# Patient Record
Sex: Male | Born: 1960 | Race: White | Hispanic: No | Marital: Married | Smoking: Current some day smoker
Health system: Southern US, Community
[De-identification: ages and names within clinical notes are randomized; demographics above are authoritative.]

## PROBLEM LIST (undated history)

## (undated) DIAGNOSIS — C8590 Non-Hodgkin lymphoma, unspecified, unspecified site: Secondary | ICD-10-CM

## (undated) DIAGNOSIS — Z8579 Personal history of other malignant neoplasms of lymphoid, hematopoietic and related tissues: Secondary | ICD-10-CM

## (undated) DIAGNOSIS — Z9889 Other specified postprocedural states: Secondary | ICD-10-CM

## (undated) DIAGNOSIS — E7800 Pure hypercholesterolemia, unspecified: Secondary | ICD-10-CM

## (undated) DIAGNOSIS — E119 Type 2 diabetes mellitus without complications: Secondary | ICD-10-CM

## (undated) DIAGNOSIS — Z9049 Acquired absence of other specified parts of digestive tract: Secondary | ICD-10-CM

## (undated) DIAGNOSIS — M67919 Unspecified disorder of synovium and tendon, unspecified shoulder: Secondary | ICD-10-CM

## (undated) DIAGNOSIS — I358 Other nonrheumatic aortic valve disorders: Secondary | ICD-10-CM

## (undated) DIAGNOSIS — Z8572 Personal history of non-Hodgkin lymphomas: Secondary | ICD-10-CM

## (undated) DIAGNOSIS — M109 Gout, unspecified: Secondary | ICD-10-CM

## (undated) DIAGNOSIS — I10 Essential (primary) hypertension: Secondary | ICD-10-CM

## (undated) HISTORY — DX: Pure hypercholesterolemia, unspecified: E78.00

## (undated) HISTORY — DX: Other specified postprocedural states: Z98.890

## (undated) HISTORY — DX: Non-Hodgkin lymphoma, unspecified, unspecified site: C85.90

## (undated) HISTORY — DX: Other nonrheumatic aortic valve disorders: I35.8

## (undated) HISTORY — PX: TONSILECTOMY, ADENOIDECTOMY, BILATERAL MYRINGOTOMY AND TUBES: SHX2538

## (undated) HISTORY — DX: Personal history of other malignant neoplasms of lymphoid, hematopoietic and related tissues: Z85.79

## (undated) HISTORY — DX: Unspecified disorder of synovium and tendon, unspecified shoulder: M67.919

## (undated) HISTORY — DX: Essential (primary) hypertension: I10

## (undated) HISTORY — DX: Acquired absence of other specified parts of digestive tract: Z90.49

## (undated) HISTORY — DX: Gout, unspecified: M10.9

## (undated) HISTORY — DX: Type 2 diabetes mellitus without complications: E11.9

## (undated) HISTORY — DX: Personal history of non-Hodgkin lymphomas: Z85.72

---

## 1998-02-08 ENCOUNTER — Ambulatory Visit (HOSPITAL_COMMUNITY): Admission: RE | Admit: 1998-02-08 | Discharge: 1998-02-08 | Payer: Self-pay | Admitting: Internal Medicine

## 1999-08-12 ENCOUNTER — Encounter: Payer: Self-pay | Admitting: Hematology and Oncology

## 1999-08-12 ENCOUNTER — Encounter: Admission: RE | Admit: 1999-08-12 | Discharge: 1999-08-12 | Payer: Self-pay | Admitting: Hematology and Oncology

## 2000-12-07 ENCOUNTER — Encounter: Payer: Self-pay | Admitting: Hematology and Oncology

## 2000-12-07 ENCOUNTER — Ambulatory Visit (HOSPITAL_COMMUNITY): Admission: RE | Admit: 2000-12-07 | Discharge: 2000-12-07 | Payer: Self-pay | Admitting: Hematology and Oncology

## 2007-08-08 ENCOUNTER — Ambulatory Visit (HOSPITAL_COMMUNITY): Admission: RE | Admit: 2007-08-08 | Discharge: 2007-08-08 | Payer: Self-pay | Admitting: Orthopedic Surgery

## 2016-07-03 DIAGNOSIS — H40053 Ocular hypertension, bilateral: Secondary | ICD-10-CM | POA: Diagnosis not present

## 2016-07-30 DIAGNOSIS — Z0000 Encounter for general adult medical examination without abnormal findings: Secondary | ICD-10-CM | POA: Diagnosis not present

## 2016-07-30 DIAGNOSIS — E119 Type 2 diabetes mellitus without complications: Secondary | ICD-10-CM | POA: Diagnosis not present

## 2016-07-30 DIAGNOSIS — I10 Essential (primary) hypertension: Secondary | ICD-10-CM | POA: Diagnosis not present

## 2017-02-04 DIAGNOSIS — M109 Gout, unspecified: Secondary | ICD-10-CM | POA: Diagnosis not present

## 2017-02-04 DIAGNOSIS — I10 Essential (primary) hypertension: Secondary | ICD-10-CM | POA: Diagnosis not present

## 2017-02-04 DIAGNOSIS — E7800 Pure hypercholesterolemia, unspecified: Secondary | ICD-10-CM | POA: Diagnosis not present

## 2017-02-04 DIAGNOSIS — E119 Type 2 diabetes mellitus without complications: Secondary | ICD-10-CM | POA: Diagnosis not present

## 2017-08-10 DIAGNOSIS — I10 Essential (primary) hypertension: Secondary | ICD-10-CM | POA: Diagnosis not present

## 2017-08-10 DIAGNOSIS — Z0000 Encounter for general adult medical examination without abnormal findings: Secondary | ICD-10-CM | POA: Diagnosis not present

## 2017-08-10 DIAGNOSIS — M109 Gout, unspecified: Secondary | ICD-10-CM | POA: Diagnosis not present

## 2017-08-10 DIAGNOSIS — E119 Type 2 diabetes mellitus without complications: Secondary | ICD-10-CM | POA: Diagnosis not present

## 2018-03-02 DIAGNOSIS — E119 Type 2 diabetes mellitus without complications: Secondary | ICD-10-CM | POA: Diagnosis not present

## 2018-03-02 DIAGNOSIS — E7800 Pure hypercholesterolemia, unspecified: Secondary | ICD-10-CM | POA: Diagnosis not present

## 2018-03-02 DIAGNOSIS — M109 Gout, unspecified: Secondary | ICD-10-CM | POA: Diagnosis not present

## 2018-03-02 DIAGNOSIS — I10 Essential (primary) hypertension: Secondary | ICD-10-CM | POA: Diagnosis not present

## 2018-03-27 DIAGNOSIS — Z23 Encounter for immunization: Secondary | ICD-10-CM | POA: Diagnosis not present

## 2018-10-27 DIAGNOSIS — Z0000 Encounter for general adult medical examination without abnormal findings: Secondary | ICD-10-CM | POA: Diagnosis not present

## 2018-10-27 DIAGNOSIS — I10 Essential (primary) hypertension: Secondary | ICD-10-CM | POA: Diagnosis not present

## 2018-10-27 DIAGNOSIS — E119 Type 2 diabetes mellitus without complications: Secondary | ICD-10-CM | POA: Diagnosis not present

## 2019-11-06 ENCOUNTER — Other Ambulatory Visit: Payer: Self-pay | Admitting: Nurse Practitioner

## 2019-11-06 ENCOUNTER — Ambulatory Visit
Admission: RE | Admit: 2019-11-06 | Discharge: 2019-11-06 | Disposition: A | Discharge status: Home / Self Care | Payer: Self-pay | Source: Ambulatory Visit | Attending: Nurse Practitioner | Admitting: Nurse Practitioner

## 2019-11-06 DIAGNOSIS — T148XXA Other injury of unspecified body region, initial encounter: Secondary | ICD-10-CM

## 2019-11-06 DIAGNOSIS — R609 Edema, unspecified: Secondary | ICD-10-CM

## 2019-11-06 DIAGNOSIS — I358 Other nonrheumatic aortic valve disorders: Secondary | ICD-10-CM
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
## 2021-04-30 ENCOUNTER — Encounter: Payer: Self-pay | Admitting: Cardiology

## 2021-06-11 ENCOUNTER — Other Ambulatory Visit: Payer: Self-pay

## 2021-06-11 ENCOUNTER — Ambulatory Visit: Payer: 59 | Admitting: Cardiology

## 2021-06-11 ENCOUNTER — Encounter: Payer: Self-pay | Admitting: Cardiology

## 2021-06-11 ENCOUNTER — Ambulatory Visit (INDEPENDENT_AMBULATORY_CARE_PROVIDER_SITE_OTHER): Payer: 59

## 2021-06-11 DIAGNOSIS — R55 Syncope and collapse: Secondary | ICD-10-CM

## 2021-06-11 DIAGNOSIS — F17200 Nicotine dependence, unspecified, uncomplicated: Secondary | ICD-10-CM

## 2021-06-11 DIAGNOSIS — I10 Essential (primary) hypertension: Secondary | ICD-10-CM

## 2021-06-11 DIAGNOSIS — I350 Nonrheumatic aortic (valve) stenosis: Secondary | ICD-10-CM | POA: Diagnosis not present

## 2021-06-11 DIAGNOSIS — E785 Hyperlipidemia, unspecified: Secondary | ICD-10-CM | POA: Diagnosis not present

## 2021-06-11 DIAGNOSIS — M67919 Unspecified disorder of synovium and tendon, unspecified shoulder: Secondary | ICD-10-CM | POA: Insufficient documentation

## 2021-06-11 NOTE — Progress Notes (Signed)
Cardiology Consultation:    Date:  06/11/2021   ID:  [name], DOB December 01, 1960, MRN [id number]  PCP:  Cyndi Bender, PA-C  Cardiologist:  Jenne Campus, MD   Referring MD: Cyndi Bender, PA-C   Chief Complaint  Patient presents with   Dizziness    Possible heart attack     History of Present Illness:    Derek Archer is a 61 y.o. male who is being seen today for the evaluation of dizziness/near syncope at the request of Cyndi Bender, Hershal Coria.  Past medical history significant for essential hypertension, diabetes, dyslipidemia, cigar smoking.  He was referred to [place] because of episode of dizziness and lightheadedness.  He described those episodes can happen when he gets up however it also happen when he is laying down and sometimes when he is sitting and sometimes when he is standing or walking is been going on for about 3 months it became a little more frequent with happen maybe 2 or 3 times a day.  He never fell down because of this, he never passed out because of this.  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  He denies have any chest pain tightness squeezing pressure burning chest.  There is no swelling of lower extremities, he does not snore.  He smokes a cigar maybe every couple weeks.  He does not have family history of premature coronary artery disease he is not on any diet does not exercise on the regular basis  Past Medical History:  Diagnosis Date   Aortic valve sclerosis    Diabetes type 2, controlled ([place])    Gout    H/O removal of cyst    L arm   History of appendectomy    History of lymphoma    Hypertension, essential, benign    Lymphoma ([place])    Treated with chemotherapy successfully, Dr. Starr Sinclair   Pure hypercholesterolemia    Rotator cuff disorder     Past Surgical History:  Procedure Laterality Date   TONSILECTOMY, ADENOIDECTOMY, BILATERAL MYRINGOTOMY AND TUBES      Current Medications: Current  Meds  Medication Sig   allopurinol (ZYLOPRIM) 100 MG tablet Take 100 mg by mouth daily.   amLODipine-benazepril (LOTREL) 10-40 MG capsule Take 1 capsule by mouth daily.   aspirin EC 81 MG tablet Take 81 mg by mouth daily. Swallow whole.   glipiZIDE (GLUCOTROL XL) 10 MG 24 hr tablet Take 20 mg by mouth daily with breakfast.   hydrochlorothiazide (HYDRODIURIL) 25 MG tablet Take 25 mg by mouth daily.   metFORMIN (GLUCOPHAGE-XR) 500 MG 24 hr tablet Take 1,000 mg by mouth 2 (two) times daily.   Omega-3 Fatty Acids (FISH OIL) 1000 MG CAPS Take 1,000 mg by mouth daily at 6 (six) AM.   pioglitazone (ACTOS) 30 MG tablet Take 30 mg by mouth daily.   potassium chloride (KLOR-CON) 20 MEQ packet Take 20 mEq by mouth daily.   rosuvastatin (CRESTOR) 20 MG tablet Take 20 mg by mouth daily.     Allergies:   Patient has no known allergies.   Social History   Socioeconomic History   Marital status: Married    Spouse name: Not on file   Number of children: Not on file   Years of education: Not on file   Highest education level: Not on file  Occupational History   Not on file  Tobacco Use   Smoking status: Some Days  Types: Cigars   Smokeless tobacco: Never  Substance and Sexual Activity   Alcohol use: Not Currently   Drug use: Never   Sexual activity: Yes  Other Topics Concern   Not on file  Social History Narrative   Not on file   Social Determinants of Health   Financial Resource Strain: Not on file  Food Insecurity: Not on file  Transportation Needs: Not on file  Physical Activity: Not on file  Stress: Not on file  Social Connections: Not on file     Family History: The patient's family history is not on file. ROS:   Please see the history of present illness.    All 14 point review of systems negative except as described per history of present illness.  EKGs/Labs/Other Studies Reviewed:    The following studies were reviewed today: Echocardiogram reviewed done in [facility] in 11/06/2019 which showed normal left ventricle, impaired relaxation, mild aortic valve sclerosis with maximum velocity across aortic valve of 2 m/s.  EKG:  EKG is  ordered today.  The ekg ordered today demonstrates normal sinus rhythm, Q wave V1 V2 cannot rule out anterior septal wall MI, nonspecific ST segment changes.  Recent Labs: No results found for requested labs within last 8760 hours.  Recent Lipid Panel No results found for: CHOL, TRIG, HDL, CHOLHDL, VLDL, LDLCALC, LDLDIRECT  Physical Exam:    VS:  BP (!) 166/70 (BP Location: Left Arm, Patient Position: Sitting)    Pulse 72    Ht 5\' 11"  (1.803 m)    Wt 215 lb 3.2 oz (97.6 kg)    SpO2 95%    BMI 30.01 kg/m     Wt Readings from Last 3 Encounters:  06/11/21 215 lb 3.2 oz (97.6 kg)  04/28/21 212 lb (96.2 kg)     GEN:  Well nourished, well developed in no acute distress HEENT: Normal NECK: No JVD; No carotid bruits LYMPHATICS: No lymphadenopathy CARDIAC: RRR, systolic ejection murmur grade 2/6 best heard right upper portion of the sternum, early peaking no rubs, no gallops RESPIRATORY:  Clear to auscultation without rales, wheezing or rhonchi  ABDOMEN: Soft, non-tender, non-distended MUSCULOSKELETAL:  No edema; No deformity  SKIN: Warm and dry NEUROLOGIC:  Alert and oriented x 3 PSYCHIATRIC:  Normal affect   ASSESSMENT:    1. Near syncope   2. Essential hypertension   3. Dyslipidemia   4. Nonrheumatic aortic valve stenosis   5. Smoking    PLAN:    In order of problems listed above:  Near syncope with dizziness.  He does have some worrisome features those episodes happen when he is sitting down when he is laying down lasting only for few seconds without any palpitations.  But I think we must rule out arrhythmia.  Therefore, I will ask him to wear Zio patch for 2 weeks to see if there is any arrhythmia that could explain his symptomatology.  As a part of evaluation echocardiogram will be done an echocardiogram  will be done also because of systolic murmur he got look like this is aortic stenosis but I think is still mild.  But his EKG showed possibility of anteroseptal wall MI which need to be clarified by doing echocardiogram. Abnormal EKG with possibly anteroseptal wall myocardial infarction.  This is a well-known phenomenon that we can see Q waves in V1 V2 which either could be related to anteroseptal wall MI or simply to this kind of morphology of QRS complex.  That need to  be clarified by doing echocardiogram which we will do.  Echocardiogram done in 2021 which I had a chance to review showed no segmental motion abnormalities.  In the meantime he is on antiplatelet therapy which I will continue. Essential hypertension seems to be very well managed.  Continue present management.  Dyslipidemia he is on statin in form of Crestor 20 which I will continue.  I did review his K PN which show me his LDL of 66 and HDL 51 it is a good cholesterol profile we will continue present management. Diabetes mellitus followed by antimedicine team his hemoglobin A1c is 7.0.  Which is acceptable currently a little better.   Medication Adjustments/Labs and Tests Ordered: Current medicines are reviewed at length with the patient today.  Concerns regarding medicines are outlined above.  No orders of the defined types were placed in this encounter.  No orders of the defined types were placed in this encounter.   Signed, Park Liter, MD, [facility]. 06/11/2021 9:24 AM    [place]

## 2021-06-11 NOTE — Patient Instructions (Addendum)
Medication Instructions:  Your physician recommends that you continue on your current medications as directed. Please refer to the Current Medication list given to you today.  *If you need a refill on your cardiac medications before your next appointment, please call your pharmacy*   Lab Work: None If you have labs (blood work) drawn today and your tests are completely normal, you will receive your results only by: [place] (if you have MyChart) OR A paper copy in the mail If you have any lab test that is abnormal or we need to change your treatment, we will call you to review the results.   Testing/Procedures: A zio monitor was ordered today. It will remain on for 14 days. You will then return monitor and event diary in provided box. It takes 1-2 weeks for report to be downloaded and returned to [place]. We will call you with the results. If monitor falls off or has orange flashing light, please call Zio for further instructions.    Your physician has requested that you have an echocardiogram. Echocardiography is a painless test that uses sound waves to create images of your heart. It provides your doctor with information about the size and shape of your heart and how well your hearts chambers and valves are working. This procedure takes approximately one hour. There are no restrictions for this procedure.    Follow-Up: At [facility], you and your health needs are our priority.  As part of our continuing mission to provide you with exceptional heart care, we have created designated Provider Care Teams.  These Care Teams include your primary Cardiologist (physician) and Advanced Practice Providers (APPs -  Physician Assistants and Nurse Practitioners) who all work together to provide you with the care you need, when you need it.  We recommend signing up for the patient portal called "MyChart".  Sign up information is provided on this After Visit Summary.  MyChart is used to connect with  patients for Virtual Visits (Telemedicine).  Patients are able to view lab/test results, encounter notes, upcoming appointments, etc.  Non-urgent messages can be sent to your provider as well.   To learn more about what you can do with MyChart, go to [email or web address].    Your next appointment:   2 month  The format for your next appointment:   In Person  Provider:   Jenne Campus, MD    Other Instructions Echocardiogram An echocardiogram is a test that uses sound waves (ultrasound) to produce images of the heart. Images from an echocardiogram can provide important information about: Heart size and shape. The size and thickness and movement of your heart's walls. Heart muscle function and strength. Heart valve function or if you have stenosis. Stenosis is when the heart valves are too narrow. If blood is flowing backward through the heart valves (regurgitation). A tumor or infectious growth around the heart valves. Areas of heart muscle that are not working well because of poor blood flow or injury from a heart attack. Aneurysm detection. An aneurysm is a weak or damaged part of an artery wall. The wall bulges out from the normal force of blood pumping through the body. Tell a health care provider about: Any allergies you have. All medicines you are taking, including vitamins, herbs, eye drops, creams, and over-the-counter medicines. Any blood disorders you have. Any surgeries you have had. Any medical conditions you have. Whether you are pregnant or may be pregnant. What are the risks? Generally, this is a safe test. However,  problems may occur, including an allergic reaction to dye (contrast) that may be used during the test. What happens before the test? No specific preparation is needed. You may eat and drink normally. What happens during the test?  You will take off your clothes from the waist up and put on a hospital gown. Electrodes or electrocardiogram  (ECG)patches may be placed on your chest. The electrodes or patches are then connected to a device that monitors your heart rate and rhythm. You will lie down on a table for an ultrasound exam. A gel will be applied to your chest to help sound waves pass through your skin. A handheld device, called a transducer, will be pressed against your chest and moved over your heart. The transducer produces sound waves that travel to your heart and bounce back (or "echo" back) to the transducer. These sound waves will be captured in real-time and changed into images of your heart that can be viewed on a video monitor. The images will be recorded on a computer and reviewed by your health care provider. You may be asked to change positions or hold your breath for a short time. This makes it easier to get different views or better views of your heart. In some cases, you may receive contrast through an IV in one of your veins. This can improve the quality of the pictures from your heart. The procedure may vary among health care providers and hospitals. What can I expect after the test? You may return to your normal, everyday life, including diet, activities, and medicines, unless your health care provider tells you not to do that. Follow these instructions at home: It is up to you to get the results of your test. Ask your health care provider, or the department that is doing the test, when your results will be ready. Keep all follow-up visits. This is important. Summary An echocardiogram is a test that uses sound waves (ultrasound) to produce images of the heart. Images from an echocardiogram can provide important information about the size and shape of your heart, heart muscle function, heart valve function, and other possible heart problems. You do not need to do anything to prepare before this test. You may eat and drink normally. After the echocardiogram is completed, you may return to your normal, everyday  life, unless your health care provider tells you not to do that. This information is not intended to replace advice given to you by your health care provider. Make sure you discuss any questions you have with your health care provider. Document Revised: 01/29/2021 Document Reviewed: 01/09/2020 Elsevier Patient Education  2022 [employer].

## 2021-06-17 ENCOUNTER — Telehealth: Payer: Self-pay | Admitting: Cardiology

## 2021-06-17 ENCOUNTER — Other Ambulatory Visit: Payer: 59

## 2021-06-17 NOTE — Telephone Encounter (Signed)
° °  Pt said he wore his heart monitor last 06/11/21 and it came off today, he wanted to know if he still need to wear it or wait until his echo on 06/19/21 to wear it again, or the data that he got from wearing it for 7 days is enough

## 2021-06-17 NOTE — Telephone Encounter (Signed)
Lvmtcb 1/17

## 2021-06-18 NOTE — Telephone Encounter (Signed)
Spoke to patient just now and let him know to go ahead and mail the monitor back to zio at this time. He verbalizes understanding and states he will mail it out tomorrow morning.    Encouraged patient to call back with any questions or concerns.

## 2021-06-19 ENCOUNTER — Ambulatory Visit (INDEPENDENT_AMBULATORY_CARE_PROVIDER_SITE_OTHER): Payer: 59

## 2021-06-19 ENCOUNTER — Other Ambulatory Visit: Payer: Self-pay

## 2021-06-19 DIAGNOSIS — I10 Essential (primary) hypertension: Secondary | ICD-10-CM | POA: Diagnosis not present

## 2021-06-19 DIAGNOSIS — E785 Hyperlipidemia, unspecified: Secondary | ICD-10-CM

## 2021-06-19 DIAGNOSIS — I350 Nonrheumatic aortic (valve) stenosis: Secondary | ICD-10-CM | POA: Diagnosis not present

## 2021-06-19 DIAGNOSIS — F17200 Nicotine dependence, unspecified, uncomplicated: Secondary | ICD-10-CM

## 2021-06-19 DIAGNOSIS — R55 Syncope and collapse: Secondary | ICD-10-CM

## 2021-06-19 DIAGNOSIS — IMO0001 Reserved for inherently not codable concepts without codable children: Secondary | ICD-10-CM

## 2021-06-19 LAB — ECHOCARDIOGRAM COMPLETE
AR max vel: 1.14 cm2
AV Area VTI: 1.08 cm2
AV Area mean vel: 1.08 cm2
AV Mean grad: 16.5 mmHg
AV Peak grad: 27 mmHg
Ao pk vel: 2.6 m/s
Area-P 1/2: 2.6 cm2
S' Lateral: 2.5 cm

## 2021-07-11 ENCOUNTER — Telehealth: Payer: Self-pay | Admitting: Cardiology

## 2021-07-11 NOTE — Telephone Encounter (Signed)
Patient returned call for test results.  °

## 2021-07-11 NOTE — Telephone Encounter (Signed)
Patient informed about results

## 2021-07-14 ENCOUNTER — Telehealth: Payer: Self-pay

## 2021-07-14 NOTE — Telephone Encounter (Signed)
LM to return my call. I also mailed a letter requesting a call back.

## 2021-07-14 NOTE — Telephone Encounter (Signed)
-----   Message from Darrel Reach, [place] sent at 07/11/2021  8:42 AM EST -----  ----- Message ----- From: Park Liter, MD Sent: 07/09/2021  10:53 AM EST To: Louie Casa, RN  Monitor showed 1 episode of ventricular tachycardia.  We will talk about this during the visit

## 2021-08-11 ENCOUNTER — Other Ambulatory Visit: Payer: Self-pay

## 2021-08-11 ENCOUNTER — Ambulatory Visit: Payer: 59 | Admitting: Cardiology

## 2021-08-11 ENCOUNTER — Encounter: Payer: Self-pay | Admitting: Cardiology

## 2021-08-11 VITALS — BP 140/70 | HR 93 | Ht 69.0 in | Wt 217.4 lb

## 2021-08-11 DIAGNOSIS — I10 Essential (primary) hypertension: Secondary | ICD-10-CM | POA: Diagnosis not present

## 2021-08-11 DIAGNOSIS — I4729 Other ventricular tachycardia: Secondary | ICD-10-CM | POA: Insufficient documentation

## 2021-08-11 DIAGNOSIS — R55 Syncope and collapse: Secondary | ICD-10-CM

## 2021-08-11 DIAGNOSIS — E785 Hyperlipidemia, unspecified: Secondary | ICD-10-CM

## 2021-08-11 DIAGNOSIS — I350 Nonrheumatic aortic (valve) stenosis: Secondary | ICD-10-CM

## 2021-08-11 NOTE — Progress Notes (Signed)
?Cardiology Office Note:   ? ?Date:  08/11/2021  ? ?ID:  Derek Archer, DOB Jun 13, 1960, MRN [id number] ? ?PCP:  Cyndi Bender, PA-C  ?Cardiologist:  Jenne Campus, MD   ? ?Referring MD: Cyndi Bender, PA-C  ? ?Chief Complaint  ?Patient presents with  ? Follow-up  ?Doing fine ? ?History of Present Illness:   ? ?Derek Archer is a 61 y.o. male who was referred to [place] originally because of episodes of near syncope.  Quite extensive evaluation has been done which included echocardiogram which showed only mild aortic stenosis, he also wore a monitor surprisingly monitor shows some runs of nonsustained ventricular tachycardia as well as episode first-degree AV block.  He did have 9 triggered events showing sinus rhythm for dizziness.  He is in my office today to talk about it.  Overall he is doing well.  Denies of any chest pain tightness squeezing pressure burning chest.  He works 2 jobs he is get up at 5:00 in the morning goes to sleep at midnight. ? ?Past Medical History:  ?Diagnosis Date  ? Aortic valve sclerosis   ? Diabetes type 2, controlled ([place])   ? Gout   ? H/O removal of cyst   ? L arm  ? History of appendectomy   ? History of lymphoma   ? Hypertension, essential, benign   ? Lymphoma ([place])   ? Treated with chemotherapy successfully, Dr. Starr Sinclair  ? Pure hypercholesterolemia   ? Rotator cuff disorder   ? ? ?Past Surgical History:  ?Procedure Laterality Date  ? TONSILECTOMY, ADENOIDECTOMY, BILATERAL MYRINGOTOMY AND TUBES    ? ? ?Current Medications: ?Current Meds  ?Medication Sig  ? allopurinol (ZYLOPRIM) 100 MG tablet Take 100 mg by mouth daily.  ? amLODipine-benazepril (LOTREL) 10-40 MG capsule Take 1 capsule by mouth daily.  ? aspirin EC 81 MG tablet Take 81 mg by mouth daily. Swallow whole.  ? glipiZIDE (GLUCOTROL XL) 10 MG 24 hr tablet Take 20 mg by mouth daily with breakfast.  ? hydrochlorothiazide (HYDRODIURIL) 25 MG tablet Take 25 mg by mouth daily.  ? metFORMIN (GLUCOPHAGE-XR) 500 MG 24 hr tablet  Take 1,000 mg by mouth 2 (two) times daily.  ? Omega-3 Fatty Acids (FISH OIL) 1000 MG CAPS Take 1,000 mg by mouth daily at 6 (six) AM.  ? pioglitazone (ACTOS) 30 MG tablet Take 30 mg by mouth daily.  ? potassium chloride (KLOR-CON) 20 MEQ packet Take 20 mEq by mouth daily.  ? rosuvastatin (CRESTOR) 20 MG tablet Take 20 mg by mouth daily.  ?  ? ?Allergies:   Jardiance [empagliflozin]  ? ?Social History  ? ?Socioeconomic History  ? Marital status: Married  ?  Spouse name: Not on file  ? Number of children: Not on file  ? Years of education: Not on file  ? Highest education level: Not on file  ?Occupational History  ? Not on file  ?Tobacco Use  ? Smoking status: Some Days  ?  Types: Cigars  ? Smokeless tobacco: Never  ?Substance and Sexual Activity  ? Alcohol use: Not Currently  ? Drug use: Never  ? Sexual activity: Yes  ?Other Topics Concern  ? Not on file  ?Social History Narrative  ? Not on file  ? ?Social Determinants of Health  ? ?Financial Resource Strain: Not on file  ?Food Insecurity: Not on file  ?Transportation Needs: Not on file  ?Physical Activity: Not on file  ?Stress: Not on file  ?Social Connections: Not on file  ?  ? ?  Family History: ?The patient's family history is not on file. ?ROS:   ?Please see the history of present illness.    ?All 14 point review of systems negative except as described per history of present illness ? ?EKGs/Labs/Other Studies Reviewed:   ? ? ? ?Recent Labs: ?No results found for requested labs within last 8760 hours.  ?Recent Lipid Panel ?No results found for: CHOL, TRIG, HDL, CHOLHDL, VLDL, LDLCALC, LDLDIRECT ? ?Physical Exam:   ? ?VS:  BP 140/70 (BP Location: Right Arm, Patient Position: Sitting)   Pulse 93   Ht '5\' 9"'$  (1.753 m)   Wt 217 lb 6.4 oz (98.6 kg)   SpO2 97%   BMI 32.10 kg/m?    ? ?Wt Readings from Last 3 Encounters:  ?08/11/21 217 lb 6.4 oz (98.6 kg)  ?06/11/21 215 lb 3.2 oz (97.6 kg)  ?04/28/21 212 lb (96.2 kg)  ?  ? ?GEN:  Well nourished, well developed in no  acute distress ?HEENT: Normal ?NECK: No JVD; No carotid bruits ?LYMPHATICS: No lymphadenopathy ?CARDIAC: RRR, systolic ejection murmur grade 2/6 best heard right upper portion of the sternum, S2 is still present, no rubs, no gallops ?RESPIRATORY:  Clear to auscultation without rales, wheezing or rhonchi  ?ABDOMEN: Soft, non-tender, non-distended ?MUSCULOSKELETAL:  No edema; No deformity  ?SKIN: Warm and dry ?LOWER EXTREMITIES: no swelling ?NEUROLOGIC:  Alert and oriented x 3 ?PSYCHIATRIC:  Normal affect  ? ?ASSESSMENT:   ? ?1. Nonrheumatic aortic valve stenosis   ?2. Nonsustained ventricular tachycardia   ?3. Essential hypertension   ?4. Near syncope   ?5. Dyslipidemia   ? ?PLAN:   ? ?In order of problems listed above: ? ?Nonrheumatic aortic valve stenosis only mild.  Should not create any symptomatology.  Obviously we will continue monitoring. ?Nonsustained ventricular tachycardia.  Echocardiogram showed preserved left ventricle ejection fraction.  Need to evaluate him for ischemia, stress test will be done. ?Essential hypertension: Blood pressure seems to be acceptably controlled continue present management. ?Dyslipidemia I did review K PN which show LDL of 66 HDL 51 and he is already on Crestor 20 which I will continue. ?Diabetes: I did review his K PN which show me his hemoglobin A1c is 7.0 he understands will be uncontrolled.  Need to be better controlled. ? ? ?Medication Adjustments/Labs and Tests Ordered: ?Current medicines are reviewed at length with the patient today.  Concerns regarding medicines are outlined above.  ?No orders of the defined types were placed in this encounter. ? ?Medication changes: No orders of the defined types were placed in this encounter. ? ? ?Signed, ?Park Liter, MD, [facility] ?08/11/2021 8:53 AM    ?[place] ?

## 2021-08-11 NOTE — Patient Instructions (Signed)
Medication Instructions:  ?Your physician recommends that you continue on your current medications as directed. Please refer to the Current Medication list given to you today. ? ?*If you need a refill on your cardiac medications before your next appointment, please call your pharmacy* ? ? ?Lab Work: ?NONE ?If you have labs (blood work) drawn today and your tests are completely normal, you will receive your results only by: ?MyChart Message (if you have MyChart) OR ?A paper copy in the mail ?If you have any lab test that is abnormal or we need to change your treatment, we will call you to review the results. ? ? ?Testing/Procedures: ?Your physician has requested that you have a lexiscan myoview. For further information please visit [email or web address]. Please follow instruction sheet, as given. ? ? ?The test will take approximately 3 to 4 hours to complete; you may bring reading material.  If someone comes with you to your appointment, they will need to remain in the main lobby due to limited space in the testing area. **If you are pregnant or breastfeeding, please notify the nuclear lab prior to your appointment** ? ?How to prepare for your Myocardial Perfusion Test: ?Do not eat or drink 3 hours prior to your test, except you may have water. ?Do not consume products containing caffeine (regular or decaffeinated) 12 hours prior to your test. (ex: coffee, chocolate, sodas, tea). ?Do bring a list of your current medications with you.  If not listed below, you may take your medications as normal. ?Do wear comfortable clothes (no dresses or overalls) and walking shoes, tennis shoes preferred (No heels or open toe shoes are allowed). ?Do NOT wear cologne, perfume, aftershave, or lotions (deodorant is allowed). ?If these instructions are not followed, your test will have to be rescheduled. ? ? ? ?Follow-Up: ?At [facility], you and your health needs are our priority.  As part of our continuing mission to provide you  with exceptional heart care, we have created designated Provider Care Teams.  These Care Teams include your primary Cardiologist (physician) and Advanced Practice Providers (APPs -  Physician Assistants and Nurse Practitioners) who all work together to provide you with the care you need, when you need it. ? ?We recommend signing up for the patient portal called "MyChart".  Sign up information is provided on this After Visit Summary.  MyChart is used to connect with patients for Virtual Visits (Telemedicine).  Patients are able to view lab/test results, encounter notes, upcoming appointments, etc.  Non-urgent messages can be sent to your provider as well.   ?To learn more about what you can do with MyChart, go to [email or web address].   ? ?Your next appointment:   ?3 month(s) ? ?The format for your next appointment:   ?In Person ? ?Provider:   ?Jenne Campus, MD  ? ? ?Other Instructions ?  ?

## 2021-08-12 ENCOUNTER — Telehealth (HOSPITAL_COMMUNITY): Payer: Self-pay | Admitting: *Deleted

## 2021-08-12 NOTE — Telephone Encounter (Signed)
Left message on voicemail per DPR in reference to upcoming appointment scheduled on 08/14/2021 at 8:15 with detailed instructions given per Myocardial Perfusion Study Information Sheet for the test. LM to arrive 15 minutes early, and that it is imperative to arrive on time for appointment to keep from having the test rescheduled. If you need to cancel or reschedule your appointment, please call the office within 24 hours of your appointment. Failure to do so may result in a cancellation of your appointment, and a $50 no show fee. Phone number given for call back for any questions.  ? ?

## 2021-08-14 ENCOUNTER — Other Ambulatory Visit: Payer: Self-pay

## 2021-08-14 ENCOUNTER — Ambulatory Visit (INDEPENDENT_AMBULATORY_CARE_PROVIDER_SITE_OTHER): Payer: 59

## 2021-08-14 DIAGNOSIS — I4729 Other ventricular tachycardia: Secondary | ICD-10-CM

## 2021-08-14 LAB — MYOCARDIAL PERFUSION IMAGING
LV dias vol: 128 mL (ref 62–150)
LV sys vol: 54 mL
Nuc Stress EF: 58 %
Peak HR: 104 {beats}/min
Rest HR: 71 {beats}/min
Rest Nuclear Isotope Dose: 10.3 mCi
SDS: 0
SRS: 0
SSS: 0
ST Depression (mm): 0 mm
Stress Nuclear Isotope Dose: 31 mCi
TID: 1.05

## 2021-08-14 MED ORDER — TECHNETIUM TC 99M TETROFOSMIN IV KIT
10.3000 | PACK | Freq: Once | INTRAVENOUS | Status: AC | PRN
  Administered 2021-08-14: 10.3 via INTRAVENOUS

## 2021-08-14 MED ORDER — REGADENOSON 0.4 MG/5ML IV SOLN
0.4000 mg | Freq: Once | INTRAVENOUS | Status: AC
Start: 2021-08-14 — End: 2021-08-14
  Administered 2021-08-14: 0.4 mg via INTRAVENOUS

## 2021-08-14 MED ORDER — TECHNETIUM TC 99M TETROFOSMIN IV KIT
31.0000 | PACK | Freq: Once | INTRAVENOUS | Status: AC | PRN
  Administered 2021-08-14: 31 via INTRAVENOUS

## 2021-10-10 ENCOUNTER — Ambulatory Visit: Payer: 59 | Admitting: Neurology

## 2021-10-10 ENCOUNTER — Encounter: Payer: Self-pay | Admitting: Neurology

## 2021-10-10 VITALS — BP 150/81 | HR 89 | Ht 69.0 in | Wt 210.5 lb

## 2021-10-10 DIAGNOSIS — H519 Unspecified disorder of binocular movement: Secondary | ICD-10-CM

## 2021-10-10 DIAGNOSIS — H5581 Saccadic eye movements: Secondary | ICD-10-CM

## 2021-10-10 DIAGNOSIS — R42 Dizziness and giddiness: Secondary | ICD-10-CM

## 2021-10-10 MED ORDER — MECLIZINE HCL 25 MG PO TABS: 25.0000 mg | ORAL_TABLET | Freq: Three times a day (TID) | ORAL | 0 refills | Status: DC | PRN

## 2021-10-10 NOTE — Progress Notes (Signed)
? ?[facility] ? ?PATIENT: Derek Archer ?DOB: Feb 11, 1961 ? ?REQUESTING CLINICIAN: Cyndi Bender, PA-C ?HISTORY FROM: Patient and spouse  ?REASON FOR VISIT: Ongoing dizziness for a year  ? ? ?HISTORICAL ? ?CHIEF COMPLAINT:  ?Chief Complaint  ?Patient presents with  ? New Patient (Initial Visit)  ?  Pt in 13 pt has huge scrape  and a black eye on left side pt states he fell yesterday. Pt states he has increased lightheaded and dizziness in the past year   ? ? ?HISTORY OF PRESENT ILLNESS:  ?This is a 61 year old gentleman past medical history of gout, hypertension, hyperlipidemia and diabetes mellitus who is presenting with complaint of ongoing dizziness for the past year.  Patient reports that has symptoms of feeling lightheaded and at times feeling room spinning sensation.  These episodes usually occur with abrupt movement of his head while at work, he reports usually when he comes under the truck and stand up, he will experience the dizziness.  He did complete physical therapy and was discharged and had a MRI brain which was negative for any acute abnormality.  Patient reports he has not tried any make medication including meclizine for the dizziness.   ?Of note he mentioned that he fell yesterday, the fall was not associated with dizziness, he reported he tripped and fell and hit the left side of his face which is now bruised.  ? ? ? ?OTHER MEDICAL CONDITIONS: Gout, hypertension, hyperlipidemia, diabetes mellitus ? ? ?REVIEW OF SYSTEMS: Full 14 system review of systems performed and negative with exception of: As noted in HPI ? ?ALLERGIES: ?Allergies  ?Allergen Reactions  ? Jardiance [Empagliflozin] Other (See Comments)  ?  Changes in mental health  ? ? ?HOME MEDICATIONS: ?Outpatient Medications Prior to Visit  ?Medication Sig Dispense Refill  ? allopurinol (ZYLOPRIM) 100 MG tablet Take 100 mg by mouth daily.    ? amLODipine-benazepril (LOTREL) 10-40 MG capsule Take 1 capsule by mouth daily.     ? aspirin EC 81 MG tablet Take 81 mg by mouth daily. Swallow whole.    ? glipiZIDE (GLUCOTROL XL) 10 MG 24 hr tablet Take 20 mg by mouth daily with breakfast.    ? hydrochlorothiazide (HYDRODIURIL) 25 MG tablet Take 25 mg by mouth daily.    ? metFORMIN (GLUCOPHAGE-XR) 500 MG 24 hr tablet Take 1,000 mg by mouth 2 (two) times daily.    ? Omega-3 Fatty Acids (FISH OIL) 1000 MG CAPS Take 1,000 mg by mouth daily at 6 (six) AM.    ? pioglitazone (ACTOS) 30 MG tablet Take 30 mg by mouth daily.    ? potassium chloride (KLOR-CON) 20 MEQ packet Take 20 mEq by mouth daily.    ? rosuvastatin (CRESTOR) 20 MG tablet Take 20 mg by mouth daily.    ? ?No facility-administered medications prior to visit.  ? ? ?PAST MEDICAL HISTORY: ?Past Medical History:  ?Diagnosis Date  ? Aortic valve sclerosis   ? Diabetes type 2, controlled ([place])   ? Gout   ? H/O removal of cyst   ? L arm  ? History of appendectomy   ? History of lymphoma   ? Hypertension, essential, benign   ? Lymphoma ([place])   ? Treated with chemotherapy successfully, Dr. Starr Sinclair  ? Pure hypercholesterolemia   ? Rotator cuff disorder   ? ? ?PAST SURGICAL HISTORY: ?Past Surgical History:  ?Procedure Laterality Date  ? TONSILECTOMY, ADENOIDECTOMY, BILATERAL MYRINGOTOMY AND TUBES    ? ? ?FAMILY HISTORY: ?Family History  ?  Problem Relation Age of Onset  ? Diabetes Mother   ? Hypertension Neg Hx   ? ? ?SOCIAL HISTORY: ?Social History  ? ?Socioeconomic History  ? Marital status: Married  ?  Spouse name: Not on file  ? Number of children: Not on file  ? Years of education: Not on file  ? Highest education level: Not on file  ?Occupational History  ? Not on file  ?Tobacco Use  ? Smoking status: Some Days  ?  Types: Cigars  ? Smokeless tobacco: Never  ?Substance and Sexual Activity  ? Alcohol use: Not Currently  ? Drug use: Never  ? Sexual activity: Yes  ?Other Topics Concern  ? Not on file  ?Social History Narrative  ? Not on file  ? ?Social Determinants of Health  ? ?Financial  Resource Strain: Not on file  ?Food Insecurity: Not on file  ?Transportation Needs: Not on file  ?Physical Activity: Not on file  ?Stress: Not on file  ?Social Connections: Not on file  ?Intimate Partner Violence: Not on file  ? ? ?PHYSICAL EXAM ? ?GENERAL EXAM/CONSTITUTIONAL: ?Vitals:  ?Vitals:  ? 10/10/21 1114 10/10/21 1118  ?BP: (!) 142/72 (!) 150/81  ?Pulse: 74 89  ?Weight: 210 lb 8 oz (95.5 kg)   ?Height: '5\' 9"'$  (1.753 m)   ? ?Body mass index is 31.09 kg/m?. ?Wt Readings from Last 3 Encounters:  ?10/10/21 210 lb 8 oz (95.5 kg)  ?08/14/21 217 lb (98.4 kg)  ?08/11/21 217 lb 6.4 oz (98.6 kg)  ? ?Patient is in no distress; well developed, nourished and groomed; neck is supple ?Large bruise on his left eyebrow ? ? ?EYES: ?Pupils round and reactive to light, Visual fields full to confrontation, Abnormal slow extraocular movements intacts, limited upgaze and downgaze   ? ?MUSCULOSKELETAL: ?Gait, strength, tone, movements noted in Neurologic exam below ? ?NEUROLOGIC: ?MENTAL STATUS:  ?   ? View : No data to display.  ?  ?  ?  ? ?awake, alert, oriented to person, place and time ?recent and remote memory intact ?normal attention and concentration ?language fluent, comprehension intact, naming intact ?fund of knowledge appropriate ? ?CRANIAL NERVE:  ?2nd, 3rd, 4th, 6th - pupils equal and reactive to light, visual fields full to confrontation, abnormal slow extraocular muscles intact, limited upgaze and downgaze. Abnormal saccades  ?5th - facial sensation symmetric ?7th - facial strength symmetric ?8th - hearing intact ?9th - palate elevates symmetrically, uvula midline ?11th - shoulder shrug symmetric ?12th - tongue protrusion midline ? ?MOTOR:  ?normal bulk and tone, full strength in the BUE, BLE ? ?SENSORY:  ?normal and symmetric to light touch, pinprick, temperature, vibration ? ?COORDINATION:  ?finger-nose-finger, fine finger movements normal ? ?REFLEXES:  ?deep tendon reflexes present and symmetric ? ?GAIT/STATION:   ?normal ? ? ?DIAGNOSTIC DATA (LABS, IMAGING, TESTING) ?- I reviewed patient records, labs, notes, testing and imaging myself where available. ? ?No results found for: WBC, HGB, HCT, MCV, PLT ?No results found for: NA, K, CL, CO2, GLUCOSE, BUN, CREATININE, CALCIUM, PROT, ALBUMIN, AST, ALT, ALKPHOS, BILITOT, GFRNONAA, GFRAA ?No results found for: CHOL, HDL, LDLCALC, LDLDIRECT, TRIG, CHOLHDL ?No results found for: HGBA1C ?No results found for: VITAMINB12 ?No results found for: TSH ? ?MRI Brain 09/09/21 ?Normal for age noncontrast MRI appearing of the brain ? ? ? ?ASSESSMENT AND PLAN ? ?61 y.o. year old male with history of diabetes mellitus, hypertension, hyperlipidemia, and gout who is presenting with ongoing dizziness for the past year.  Patient reports the  dizziness is most noticed with abrupt change in position.  He does not have dizziness when sitting still.  On exam today he does not report dizziness.  His extraocular movements are abnormal, he has limited upward and downward gaze and abnormal slow saccades.  His recent MRI brain was also negative for any cranial abnormality.  Patient complained of dizziness is likely due to benign paroxysmal positional vertigo but I would like him to see ENT for further work-up as his MRI brain was normal.  On exam today he was noted to have abnormal eye movement and I advised him to talk to his primary care doctor to see if he can get a referral for neuro-ophthalmology as the abnormal eye movements might be related to the dizziness.  I will see him in 6 months for follow-up. ?At the moment I recommend light work duties, until all the work-up is done and he get clearance from either neurology or Otolaryngologist.  Patient and spouse are comfortable with plans. ? ? ?1. Dizziness   ?2. Abnormal eye movements   ?3. Abnormal saccadic eye movement   ? ? ?Patient Instructions  ?Start Meclizine 25 three time a day as needed for the dizziness ?Follow up with your primary care doctor for  referral to ENT for ongoing dizziness and Neuro-Ophthalmology for abnormal saccades and eye movements ?Return in 6 months  ? ?No orders of the defined types were placed in this encounter. ? ? ?Meds order

## 2021-10-10 NOTE — Patient Instructions (Signed)
Start Meclizine 25 three time a day as needed for the dizziness ?Follow up with your primary care doctor for referral to ENT for ongoing dizziness and Neuro-Ophthalmology for abnormal saccades and eye movements ?Return in 6 months  ?

## 2021-10-19 ENCOUNTER — Other Ambulatory Visit: Payer: Self-pay | Admitting: Neurology

## 2021-10-22 ENCOUNTER — Other Ambulatory Visit: Payer: Self-pay | Admitting: Neurology

## 2021-11-19 ENCOUNTER — Ambulatory Visit: Payer: 59 | Admitting: Cardiology

## 2021-12-19 ENCOUNTER — Emergency Department (HOSPITAL_COMMUNITY): Payer: No Typology Code available for payment source

## 2021-12-19 ENCOUNTER — Emergency Department (HOSPITAL_COMMUNITY)
Admission: EM | Admit: 2021-12-19 | Discharge: 2021-12-19 | Disposition: A | Discharge status: Home / Self Care | Payer: No Typology Code available for payment source | Attending: Emergency Medicine | Admitting: Emergency Medicine

## 2021-12-19 ENCOUNTER — Other Ambulatory Visit: Payer: Self-pay

## 2021-12-19 ENCOUNTER — Encounter (HOSPITAL_COMMUNITY): Payer: Self-pay | Admitting: Emergency Medicine

## 2021-12-19 DIAGNOSIS — S5002XA Contusion of left elbow, initial encounter: Secondary | ICD-10-CM | POA: Insufficient documentation

## 2021-12-19 DIAGNOSIS — S59902A Unspecified injury of left elbow, initial encounter: Secondary | ICD-10-CM | POA: Diagnosis present

## 2021-12-19 DIAGNOSIS — E119 Type 2 diabetes mellitus without complications: Secondary | ICD-10-CM | POA: Insufficient documentation

## 2021-12-19 DIAGNOSIS — Z7982 Long term (current) use of aspirin: Secondary | ICD-10-CM | POA: Insufficient documentation

## 2021-12-19 DIAGNOSIS — Y990 Civilian activity done for income or pay: Secondary | ICD-10-CM | POA: Insufficient documentation

## 2021-12-19 DIAGNOSIS — W19XXXA Unspecified fall, initial encounter: Secondary | ICD-10-CM

## 2021-12-19 DIAGNOSIS — W1830XA Fall on same level, unspecified, initial encounter: Secondary | ICD-10-CM | POA: Diagnosis not present

## 2021-12-19 DIAGNOSIS — Z7984 Long term (current) use of oral hypoglycemic drugs: Secondary | ICD-10-CM | POA: Diagnosis not present

## 2021-12-19 DIAGNOSIS — Z23 Encounter for immunization: Secondary | ICD-10-CM | POA: Insufficient documentation

## 2021-12-19 MED ORDER — TETANUS-DIPHTH-ACELL PERTUSSIS 5-2.5-18.5 LF-MCG/0.5 IM SUSY
0.5000 mL | PREFILLED_SYRINGE | Freq: Once | INTRAMUSCULAR | Status: AC
  Administered 2021-12-19: 0.5 mL via INTRAMUSCULAR
  Filled 2021-12-19: qty 0.5

## 2021-12-19 NOTE — ED Triage Notes (Signed)
Patient fell off a three step and landed on his back, hit first with left elbow. He states that he fell at work. Patient states that the elbow is very swollen.  CSMTs  and pulses intact in left arm.

## 2021-12-19 NOTE — Discharge Instructions (Addendum)
Take Tylenol or Motrin for pain.  Follow-up with the orthopedic if he had any problems

## 2021-12-19 NOTE — ED Provider Triage Note (Signed)
Emergency Medicine Provider Triage Evaluation Note  Derek Archer , a 61 y.o. male  was evaluated in triage.  Pt complains of left elbow pain.  Reports he fell off a 3 step ladder hitting his elbow on the ground first.  Patient denies any loss of consciousness, neck pain, back pain, numbness, weakness.  Patient is left-hand dominant.  Patient is not on any blood thinners.  Review of Systems  Positive: Left elbow pain Negative: See above  Physical Exam  BP (!) 149/85 (BP Location: Right Arm)   Pulse 78   Temp 97.9 F (36.6 C) (Oral)   Resp 18   SpO2 97%  Gen:   Awake, no distress   Resp:  Normal effort  MSK:   Moves extremities without difficulty  Other:  +2 left radial pulse.  Patient has full range of motion to left wrist.  No tenderness, bony tenderness, or deformity to left wrist or left hand.  Decreased range of motion to left elbow secondary to complaints of pain.  Medical Decision Making  Medically screening exam initiated at 8:27 PM.  Appropriate orders placed.  Oliver Heitzenrater was informed that the remainder of the evaluation will be completed by another provider, this initial triage assessment does not replace that evaluation, and the importance of remaining in the ED until their evaluation is complete.  X-ray imaging ordered.   Loni Beckwith, [place] 12/19/21 2028

## 2021-12-19 NOTE — ED Provider Notes (Signed)
[facility] EMERGENCY DEPARTMENT Provider Note   CSN: [id number] Arrival date & time: 12/19/21  2015     History {Add pertinent medical, surgical, social history, OB history to HPI:1} Chief Complaint  Patient presents with   Elbow Pain    Rodriguez Aguinaldo is a 61 y.o. male.  Patient fell and landed on his left elbow and back.  Patient has no pain.  He has a some swelling to his left elbow.  Patient has a history of diabetes   Fall       Home Medications Prior to Admission medications   Medication Sig Start Date End Date Taking? Authorizing Provider  allopurinol (ZYLOPRIM) 100 MG tablet Take 100 mg by mouth daily.    [provider]  amLODipine-benazepril (LOTREL) 10-40 MG capsule Take 1 capsule by mouth daily.    [provider]  aspirin EC 81 MG tablet Take 81 mg by mouth daily. Swallow whole.    [provider]  glipiZIDE (GLUCOTROL XL) 10 MG 24 hr tablet Take 20 mg by mouth daily with breakfast.    [provider]  hydrochlorothiazide (HYDRODIURIL) 25 MG tablet Take 25 mg by mouth daily.    [provider]  meclizine (ANTIVERT) 25 MG tablet Take 1 tablet (25 mg total) by mouth 3 (three) times daily as needed for dizziness. 10/10/21   Alric Ran, MD  metFORMIN (GLUCOPHAGE-XR) 500 MG 24 hr tablet Take 1,000 mg by mouth 2 (two) times daily.    [provider]  Omega-3 Fatty Acids (FISH OIL) 1000 MG CAPS Take 1,000 mg by mouth daily at 6 (six) AM.    [provider]  pioglitazone (ACTOS) 30 MG tablet Take 30 mg by mouth daily.    [provider]  potassium chloride (KLOR-CON) 20 MEQ packet Take 20 mEq by mouth daily.    [provider]  rosuvastatin (CRESTOR) 20 MG tablet Take 20 mg by mouth daily.    [provider]      Allergies    Jardiance [empagliflozin]    Review of Systems   Review of Systems  Physical Exam Updated Vital Signs BP (!) 149/85 (BP Location:  Right Arm)   Pulse 78   Temp 97.9 F (36.6 C) (Oral)   Resp 18   SpO2 97%  Physical Exam  ED Results / Procedures / Treatments   Labs (all labs ordered are listed, but only abnormal results are displayed) Labs Reviewed - No data to display  EKG None  Radiology DG Elbow Complete Left  Result Date: 12/19/2021 CLINICAL DATA:  Elbow pain and swelling after a fall. EXAM: LEFT ELBOW - COMPLETE 3+ VIEW COMPARISON:  None Available. FINDINGS: Degenerative changes in the right elbow joint. Suggestion of linear lucency in the right radial head likely representing a nondisplaced radial head fracture. Prominent olecranon spur with calcifications in the adjacent soft tissues suggesting calcific tendinosis or old ununited ossicles. Soft tissue swelling over the olecranon may represent olecranon bursitis. IMPRESSION: 1. Linear lucency in the right radial head suggesting nondisplaced fracture. 2. Degenerative changes in the right elbow. 3. Prominent olecranon spur with adjacent soft tissue calcification and overlying soft tissue swelling suggesting olecranon bursitis and calcific tendinosis. Electronically Signed   By: Lucienne Capers M.D.   On: 12/19/2021 20:46    Procedures Procedures  {Document cardiac monitor, telemetry assessment procedure when appropriate:1}  Medications Ordered in ED Medications  tetanus & diphtheria toxoids (adult) (TENIVAC) injection 0.5 mL (has no administration in  time range)    ED Course/ Medical Decision Making/ A&P                           Medical Decision Making Risk Prescription drug management.   Patient with contusion to left elbow.  He is told to take Tylenol or Motrin and follow-up with orthopedics if he has any problem  {Document critical care time when appropriate:1} {Document review of labs and clinical decision tools ie heart score, Chads2Vasc2 etc:1}  {Document your independent review of radiology images, and any outside records:1} {Document your  discussion with family members, caretakers, and with consultants:1} {Document social determinants of health affecting pt's care:1} {Document your decision making why or why not admission, treatments were needed:1} Final Clinical Impression(s) / ED Diagnoses Final diagnoses:  Fall, initial encounter    Rx / DC Orders ED Discharge Orders     None

## 2022-02-20 ENCOUNTER — Ambulatory Visit: Payer: 59 | Attending: Cardiology | Admitting: Cardiology

## 2022-02-20 VITALS — BP 142/72 | HR 72 | Ht 69.0 in | Wt 214.0 lb

## 2022-02-20 DIAGNOSIS — E785 Hyperlipidemia, unspecified: Secondary | ICD-10-CM | POA: Diagnosis not present

## 2022-02-20 DIAGNOSIS — R55 Syncope and collapse: Secondary | ICD-10-CM | POA: Diagnosis not present

## 2022-02-20 DIAGNOSIS — I350 Nonrheumatic aortic (valve) stenosis: Secondary | ICD-10-CM

## 2022-02-20 DIAGNOSIS — I10 Essential (primary) hypertension: Secondary | ICD-10-CM | POA: Diagnosis not present

## 2022-02-20 NOTE — Progress Notes (Signed)
Cardiology Office Note:    Date:  02/20/2022   ID:  Derek Archer, DOB 1960/09/30, MRN [id number]  PCP:  Derek Bender, PA-C  Cardiologist:  Derek Campus, MD    Referring MD: Derek Bender, PA-C   Chief Complaint  Patient presents with   Follow-up  Doing fine  History of Present Illness:    Derek Archer is a 61 y.o. 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  He comes today to my office for follow-up.  Overall doing very well.  He has suffered from fall couple months ago but he simply got tangled his legs and ended up falling down.  He denies having any chest pain tightness squeezing pressure burning chest he does what he wants to do.  He walks with a heavy equipment have no difficulty doing it  Past Medical History:  Diagnosis Date   Aortic valve sclerosis    Diabetes type 2, controlled ([place])    Gout    H/O removal of cyst    L arm   History of appendectomy    History of lymphoma    Hypertension, essential, benign    Lymphoma ([place])    Treated with chemotherapy successfully, Dr. Starr Archer   Pure hypercholesterolemia    Rotator cuff disorder     Past Surgical History:  Procedure Laterality Date   TONSILECTOMY, ADENOIDECTOMY, BILATERAL MYRINGOTOMY AND TUBES      Current Medications: Current Meds  Medication Sig   allopurinol (ZYLOPRIM) 100 MG tablet Take 100 mg by mouth daily.   amLODipine-benazepril (LOTREL) 10-40 MG capsule Take 1 capsule by mouth daily.   aspirin EC 81 MG tablet Take 81 mg by mouth daily. Swallow whole.   glipiZIDE (GLUCOTROL XL) 10 MG 24 hr tablet Take 20 mg by mouth daily with breakfast.   hydrochlorothiazide (HYDRODIURIL) 25 MG tablet Take 25 mg by mouth daily.   meclizine (ANTIVERT) 25 MG tablet Take 1 tablet (25 mg total) by mouth 3 (three) times daily as needed for dizziness.    metFORMIN (GLUCOPHAGE-XR) 500 MG 24 hr tablet Take 1,000 mg by mouth 2 (two) times daily.   Omega-3 Fatty Acids (FISH OIL) 1000 MG CAPS Take 1,000 mg by mouth daily at 6 (six) AM.   pioglitazone (ACTOS) 30 MG tablet Take 30 mg by mouth daily.   potassium chloride (KLOR-CON) 20 MEQ packet Take 20 mEq by mouth daily.   rosuvastatin (CRESTOR) 20 MG tablet Take 20 mg by mouth daily.     Allergies:   Jardiance [empagliflozin]   Social History   Socioeconomic History   Marital status: Married    Spouse name: Not on file   Number of children: Not on file   Years of education: Not on file   Highest education level: Not on file  Occupational History   Not on file  Tobacco Use   Smoking status: Some Days    Types: Cigars   Smokeless tobacco: Never  Substance and Sexual Activity   Alcohol use: Not Currently   Drug use: Never   Sexual activity: Yes  Other Topics Concern   Not on file  Social History Narrative   Not on file   Social Determinants of Health   Financial Resource Strain: Not on file  Food Insecurity: Not on file  Transportation Needs: Not on file  Physical Activity: Not on file  Stress: Not on  file  Social Connections: Not on file     Family History: The patient's family history includes Diabetes in his mother. There is no history of Hypertension. ROS:   Please see the history of present illness.    All 14 point review of systems negative except as described per history of present illness  EKGs/Labs/Other Studies Reviewed:      Recent Labs: No results found for requested labs within last 365 days.  Recent Lipid Panel No results found for: "CHOL", "TRIG", "HDL", "CHOLHDL", "VLDL", "LDLCALC", "LDLDIRECT"  Physical Exam:    VS:  BP (!) 142/72 (BP Location: Left Arm, Patient Position: Sitting)   Pulse 72   Ht '5\' 9"'$  (1.753 m)   Wt 214 lb (97.1 kg)   SpO2 94%   BMI 31.60 kg/m     Wt Readings from Last 3 Encounters:  02/20/22 214 lb (97.1 kg)  10/10/21  210 lb 8 oz (95.5 kg)  08/14/21 217 lb (98.4 kg)     GEN:  Well nourished, well developed in no acute distress HEENT: Normal NECK: No JVD; No carotid bruits LYMPHATICS: No lymphadenopathy CARDIAC: RRR, soft systolic ejection murmur grade 3/6 best heard right upper portion of the sternum, S2 is still present, no rubs, no gallops RESPIRATORY:  Clear to auscultation without rales, wheezing or rhonchi  ABDOMEN: Soft, non-tender, non-distended MUSCULOSKELETAL:  No edema; No deformity  SKIN: Warm and dry LOWER EXTREMITIES: no swelling NEUROLOGIC:  Alert and oriented x 3 PSYCHIATRIC:  Normal affect   ASSESSMENT:    1. Nonrheumatic aortic valve stenosis   2. Near syncope   3. Essential hypertension   4. Dyslipidemia    PLAN:    In order of problems listed above:  Nonrheumatic aortic valve stenosis mild to moderate.  We will schedule him to have echocardiogram probably in about a year.  He is completely asymptomatic 1 more time awake about signs and symptoms of critical aortic stenosis which involves chest pain dizziness passing out or shortness of breath.  Please let me know if he would develop dose Near syncope.  So far all testing is unrevealing doing well from that point review denies having any dizziness  Hypertension blood pressure seems to well controlled which I will continue present management Dyslipidemia, he is LDL 67 HDL 51 this is from February 12, 2022 done by primary care physician he is on Crestor 20 which is high intense statin which I will continue Diabetes mellitus I did review his hemoglobin A1c done by primary care physician with a's 8.4 need to be better controlled.   Medication Adjustments/Labs and Tests Ordered: Current medicines are reviewed at length with the patient today.  Concerns regarding medicines are outlined above.  No orders of the defined types were placed in this encounter.  Medication changes: No orders of the defined types were placed in this  encounter.   Signed, Park Liter, MD, [facility] 02/20/2022 4:56 PM    [place]

## 2022-02-20 NOTE — Patient Instructions (Signed)
Medication Instructions:  Your physician recommends that you continue on your current medications as directed. Please refer to the Current Medication list given to you today.  *If you need a refill on your cardiac medications before your next appointment, please call your pharmacy*   Lab Work: None If you have labs (blood work) drawn today and your tests are completely normal, you will receive your results only by: MyChart Message (if you have MyChart) OR A paper copy in the mail If you have any lab test that is abnormal or we need to change your treatment, we will call you to review the results.   Testing/Procedures: None   Follow-Up: At [facility], you and your health needs are our priority.  As part of our continuing mission to provide you with exceptional heart care, we have created designated Provider Care Teams.  These Care Teams include your primary Cardiologist (physician) and Advanced Practice Providers (APPs -  Physician Assistants and Nurse Practitioners) who all work together to provide you with the care you need, when you need it.  We recommend signing up for the patient portal called "MyChart".  Sign up information is provided on this After Visit Summary.  MyChart is used to connect with patients for Virtual Visits (Telemedicine).  Patients are able to view lab/test results, encounter notes, upcoming appointments, etc.  Non-urgent messages can be sent to your provider as well.   To learn more about what you can do with MyChart, go to [email or web address].    Your next appointment:   1 year(s)  The format for your next appointment:   In Person  Provider:   Robert Krasowski, MD    Other Instructions   Important Information About Sugar       

## 2022-04-13 ENCOUNTER — Ambulatory Visit: Payer: 59 | Admitting: Neurology

## 2023-10-11 DIAGNOSIS — M109 Gout, unspecified: Secondary | ICD-10-CM | POA: Diagnosis not present

## 2023-10-11 DIAGNOSIS — E7800 Pure hypercholesterolemia, unspecified: Secondary | ICD-10-CM | POA: Diagnosis not present

## 2023-10-11 DIAGNOSIS — E119 Type 2 diabetes mellitus without complications: Secondary | ICD-10-CM | POA: Diagnosis not present

## 2023-10-11 DIAGNOSIS — G231 Progressive supranuclear ophthalmoplegia [Steele-Richardson-Olszewski]: Secondary | ICD-10-CM | POA: Diagnosis not present

## 2023-10-11 DIAGNOSIS — I10 Essential (primary) hypertension: Secondary | ICD-10-CM | POA: Diagnosis not present

## 2023-10-11 DIAGNOSIS — G2581 Restless legs syndrome: Secondary | ICD-10-CM | POA: Diagnosis not present

## 2023-10-11 DIAGNOSIS — R413 Other amnesia: Secondary | ICD-10-CM | POA: Diagnosis not present

## 2023-10-11 DIAGNOSIS — Z9181 History of falling: Secondary | ICD-10-CM | POA: Diagnosis not present

## 2023-10-11 DIAGNOSIS — R269 Unspecified abnormalities of gait and mobility: Secondary | ICD-10-CM | POA: Diagnosis not present

## 2024-01-21 DIAGNOSIS — M109 Gout, unspecified: Secondary | ICD-10-CM | POA: Diagnosis not present

## 2024-01-21 DIAGNOSIS — I10 Essential (primary) hypertension: Secondary | ICD-10-CM | POA: Diagnosis not present

## 2024-01-21 DIAGNOSIS — S01311A Laceration without foreign body of right ear, initial encounter: Secondary | ICD-10-CM | POA: Diagnosis not present

## 2024-01-21 DIAGNOSIS — G2581 Restless legs syndrome: Secondary | ICD-10-CM | POA: Diagnosis not present

## 2024-01-21 DIAGNOSIS — Z23 Encounter for immunization: Secondary | ICD-10-CM | POA: Diagnosis not present

## 2024-01-21 DIAGNOSIS — R413 Other amnesia: Secondary | ICD-10-CM | POA: Diagnosis not present

## 2024-01-21 DIAGNOSIS — E119 Type 2 diabetes mellitus without complications: Secondary | ICD-10-CM | POA: Diagnosis not present

## 2024-01-21 DIAGNOSIS — E7800 Pure hypercholesterolemia, unspecified: Secondary | ICD-10-CM | POA: Diagnosis not present

## 2024-01-21 DIAGNOSIS — G231 Progressive supranuclear ophthalmoplegia [Steele-Richardson-Olszewski]: Secondary | ICD-10-CM | POA: Diagnosis not present

## 2024-01-24 DIAGNOSIS — S01311A Laceration without foreign body of right ear, initial encounter: Secondary | ICD-10-CM | POA: Diagnosis not present

## 2024-06-01 DEATH — deceased
